# Patient Record
Sex: Male | Born: 1955 | Race: White | Hispanic: No | Marital: Single
Health system: Southern US, Community
[De-identification: ages and names within clinical notes are randomized; demographics above are authoritative.]

## PROBLEM LIST (undated history)

## (undated) DIAGNOSIS — I10 Essential (primary) hypertension: Secondary | ICD-10-CM

## (undated) DIAGNOSIS — E119 Type 2 diabetes mellitus without complications: Secondary | ICD-10-CM

---

## 2021-10-20 ENCOUNTER — Other Ambulatory Visit: Payer: Self-pay

## 2021-10-20 ENCOUNTER — Emergency Department (HOSPITAL_COMMUNITY)
Admission: EM | Admit: 2021-10-20 | Discharge: 2021-10-20 | Disposition: A | Discharge status: Home / Self Care | Payer: Medicare Other | Attending: Emergency Medicine | Admitting: Emergency Medicine

## 2021-10-20 ENCOUNTER — Encounter (HOSPITAL_COMMUNITY): Payer: Self-pay

## 2021-10-20 ENCOUNTER — Emergency Department (HOSPITAL_COMMUNITY): Payer: Medicare Other

## 2021-10-20 DIAGNOSIS — E162 Hypoglycemia, unspecified: Secondary | ICD-10-CM | POA: Diagnosis not present

## 2021-10-20 DIAGNOSIS — R062 Wheezing: Secondary | ICD-10-CM | POA: Insufficient documentation

## 2021-10-20 DIAGNOSIS — R404 Transient alteration of awareness: Secondary | ICD-10-CM

## 2021-10-20 DIAGNOSIS — R4182 Altered mental status, unspecified: Secondary | ICD-10-CM | POA: Diagnosis present

## 2021-10-20 DIAGNOSIS — Z8616 Personal history of COVID-19: Secondary | ICD-10-CM | POA: Insufficient documentation

## 2021-10-20 HISTORY — DX: Essential (primary) hypertension: I10

## 2021-10-20 HISTORY — DX: Type 2 diabetes mellitus without complications: E11.9

## 2021-10-20 LAB — CBC WITH DIFFERENTIAL/PLATELET
Abs Immature Granulocytes: 0.03 10*3/uL (ref 0.00–0.07)
Basophils Absolute: 0 10*3/uL (ref 0.0–0.1)
Basophils Relative: 1 %
Eosinophils Absolute: 0.1 10*3/uL (ref 0.0–0.5)
Eosinophils Relative: 3 %
HCT: 36.4 % — ABNORMAL LOW (ref 39.0–52.0)
Hemoglobin: 12 g/dL — ABNORMAL LOW (ref 13.0–17.0)
Immature Granulocytes: 1 %
Lymphocytes Relative: 8 %
Lymphs Abs: 0.2 10*3/uL — ABNORMAL LOW (ref 0.7–4.0)
MCH: 32.7 pg (ref 26.0–34.0)
MCHC: 33 g/dL (ref 30.0–36.0)
MCV: 99.2 fL (ref 80.0–100.0)
Monocytes Absolute: 0.4 10*3/uL (ref 0.1–1.0)
Monocytes Relative: 14 %
Neutro Abs: 2.2 10*3/uL (ref 1.7–7.7)
Neutrophils Relative %: 73 %
Platelets: 139 10*3/uL — ABNORMAL LOW (ref 150–400)
RBC: 3.67 MIL/uL — ABNORMAL LOW (ref 4.22–5.81)
RDW: 13.9 % (ref 11.5–15.5)
WBC: 3 10*3/uL — ABNORMAL LOW (ref 4.0–10.5)
nRBC: 0 % (ref 0.0–0.2)

## 2021-10-20 LAB — COMPREHENSIVE METABOLIC PANEL
ALT: 57 U/L — ABNORMAL HIGH (ref 0–44)
AST: 30 U/L (ref 15–41)
Albumin: 2.4 g/dL — ABNORMAL LOW (ref 3.5–5.0)
Alkaline Phosphatase: 60 U/L (ref 38–126)
Anion gap: 8 (ref 5–15)
BUN: 12 mg/dL (ref 8–23)
CO2: 27 mmol/L (ref 22–32)
Calcium: 8 mg/dL — ABNORMAL LOW (ref 8.9–10.3)
Chloride: 100 mmol/L (ref 98–111)
Creatinine, Ser: 0.72 mg/dL (ref 0.61–1.24)
GFR, Estimated: >60 mL/min (ref >60)
Glucose, Bld: 60 mg/dL — ABNORMAL LOW (ref 70–99)
Potassium: 3.5 mmol/L (ref 3.5–5.1)
Sodium: 135 mmol/L (ref 135–145)
Total Bilirubin: 0.9 mg/dL (ref 0.3–1.2)
Total Protein: 5.2 g/dL — ABNORMAL LOW (ref 6.5–8.1)

## 2021-10-20 LAB — CBG MONITORING, ED
Glucose-Capillary: 64 mg/dL — ABNORMAL LOW (ref 70–99)
Glucose-Capillary: 50 mg/dL — ABNORMAL LOW (ref 70–99)
Glucose-Capillary: 64 mg/dL — ABNORMAL LOW (ref 70–99)
Glucose-Capillary: 106 mg/dL — ABNORMAL HIGH (ref 70–99)
Glucose-Capillary: 125 mg/dL — ABNORMAL HIGH (ref 70–99)

## 2021-10-20 MED ORDER — IPRATROPIUM-ALBUTEROL 0.5-2.5 (3) MG/3ML IN SOLN
3.0000 mL | Freq: Once | RESPIRATORY_TRACT | Status: AC
  Administered 2021-10-20: 3 mL via RESPIRATORY_TRACT
  Filled 2021-10-20: qty 3

## 2021-10-20 NOTE — ED Provider Notes (Signed)
[facility] Provider Note   CSN: [id number] Arrival date & time: 10/20/21  [id number]     History  Chief Complaint  Patient presents with   Altered Mental Status    Edwin BickersMichael Andrews is a 66 y.o. male.  66 yo M with a chief complaint of confusion.  That was noted this morning.  Was found to be hypoglycemic with a blood sugar in the 30s per him.  He was given D50 with EMS with improvement of his mental status.  For some reason the EMS provider felt that he might be having a stroke and so bypassed multiple hospitals to bring him here.  They felt like even though his mental status improved completely with D50 there might be something else going on.  He does endorse being recently in the hospital for COVID and has been treated with antibiotics and steroids.  The history is provided by the patient.  Altered Mental Status     Home Medications Prior to Admission medications   Not on File      Allergies    Patient has no known allergies.    Review of Systems   Review of Systems  Physical Exam Updated Vital Signs BP 101/65    Pulse 76    Temp 97.6 F (36.4 C) (Oral)    Resp 16    Ht 6\' 3"  (1.905 m)    Wt 136.1 kg    SpO2 100%    BMI 37.50 kg/m  Physical Exam Vitals and nursing note reviewed.  Constitutional:      Appearance: He is well-developed.  HENT:     Head: Normocephalic and atraumatic.  Eyes:     Pupils: Pupils are equal, round, and reactive to light.  Neck:     Vascular: No JVD.  Cardiovascular:     Rate and Rhythm: Normal rate and regular rhythm.     Heart sounds: No murmur heard.   No friction rub. No gallop.  Pulmonary:     Effort: No respiratory distress.     Breath sounds: Wheezing present.     Comments: Diffuse wheezes.  Prolonged expiratory effort.  Abdominal:     General: There is no distension.     Tenderness: There is no abdominal tenderness. There is no guarding or rebound.  Musculoskeletal:        General: Normal  range of motion.     Cervical back: Normal range of motion and neck supple.  Skin:    Coloration: Skin is not pale.     Findings: No rash.  Neurological:     Mental Status: He is alert and oriented to person, place, and time.  Psychiatric:        Behavior: Behavior normal.    ED Results / Procedures / Treatments   Labs (all labs ordered are listed, but only abnormal results are displayed) Labs Reviewed  CBC WITH DIFFERENTIAL/PLATELET - Abnormal; Notable for the following components:      Result Value   WBC 3.0 (*)    RBC 3.67 (*)    Hemoglobin 12.0 (*)    HCT 36.4 (*)    Platelets 139 (*)    Lymphs Abs 0.2 (*)    All other components within normal limits  COMPREHENSIVE METABOLIC PANEL - Abnormal; Notable for the following components:   Glucose, Bld 60 (*)    Calcium 8.0 (*)    Total Protein 5.2 (*)    Albumin 2.4 (*)    ALT 57 (*)  All other components within normal limits  CBG MONITORING, ED - Abnormal; Notable for the following components:   Glucose-Capillary 64 (*)    All other components within normal limits  CBG MONITORING, ED - Abnormal; Notable for the following components:   Glucose-Capillary 64 (*)    All other components within normal limits  CBG MONITORING, ED - Abnormal; Notable for the following components:   Glucose-Capillary 50 (*)    All other components within normal limits  CBG MONITORING, ED - Abnormal; Notable for the following components:   Glucose-Capillary 106 (*)    All other components within normal limits  CBG MONITORING, ED - Abnormal; Notable for the following components:   Glucose-Capillary 125 (*)    All other components within normal limits    EKG None  Radiology DG Chest Port 1 View  Result Date: 10/20/2021 CLINICAL DATA:  Shortness of breath. EXAM: PORTABLE CHEST 1 VIEW COMPARISON:  10/11/2021.  10/05/2021. FINDINGS: Cardiac silhouette is normal in size. No mediastinal or hilar masses. Stable left anterior chest wall  Port-A-Cath. There is linear opacity extending laterally from the left hilum, new from the prior exam. There is persistent lung base opacities, greater on the left. Remainder of the lungs is clear. Suspected small left effusion. No pneumothorax. IMPRESSION: 1. Findings are similar to the most recent prior exam. 2. There is lung base opacities, greater on the left, which may reflect atelectasis and/or pneumonia. Small left effusion suspected. Mild linear opacity in the left mid lung is new, also consistent with atelectasis or infiltrate. No convincing pulmonary edema. Electronically Signed   By: Amie Portland M.D.   On: 10/20/2021 09:27    Procedures Procedures    Medications Ordered in ED Medications  ipratropium-albuterol (DUONEB) 0.5-2.5 (3) MG/3ML nebulizer solution 3 mL (3 mLs Nebulization Given 10/20/21 1016)    ED Course/ Medical Decision Making/ A&P                           Medical Decision Making Amount and/or Complexity of Data Reviewed Labs: ordered. Radiology: ordered.  Risk Prescription drug management.   Patient is a 66 y.o. male with a cc of altered mental status.  Was found to be hypoglycemic per EMS and was given D50 with improvement.  Patient tells me that he has been having some mild difficulty coughing but has been going on for a couple years.  He thinks is due to his COPD.  Tells me that he always seems to have pneumonia.  Is chronically on 3 L of oxygen at all times.  He denies any recent medication change other than antibiotics and steroids posthospitalization.  He denies any change to his insulin therapy.  Has been eating and drinking without issue.  Denies nausea vomiting or diarrhea.  I will obtain a laboratory evaluation to assess for renal dysfunction.  We will observe in the ER.  He is wheezing on my exam and will obtain a chest x-ray and give a DuoNeb.  Reassess. I reviewed the patients chart and he was recently in [facility] with COVID was treated with  both antiviral medicines and antibiotics and steroids..  I independently interpreted the patients labs and imaging chest x-ray viewed by me with possible left lower lobe infiltrate.  Seems consistent with his prior x-rays per radiology.  With him recently being treated with antibiotics for viral pneumonia do not feel strongly that I should reinitiate antibiotic therapy.  His blood sugar is  persistently low here though the patient has what seems like poor perfusion at baseline once the patient's hands was warmed his blood sugar had improved.  He was able to eat and drink here without issue and has had no recurrence of altered mental status.  We will have the patient follow-up with his family doctor in the office.  We will have him hold off on taking his insulin today.Marland Kitchen  CRITICAL CARE Performed by: Rae Roam   Total critical care time: 35 minutes  Critical care time was exclusive of separately billable procedures and treating other patients.  Critical care was necessary to treat or prevent imminent or life-threatening deterioration.  Critical care was time spent personally by me on the following activities: development of treatment plan with patient and/or surrogate as well as nursing, discussions with consultants, evaluation of patient's response to treatment, examination of patient, obtaining history from patient or surrogate, ordering and performing treatments and interventions, ordering and review of laboratory studies, ordering and review of radiographic studies, pulse oximetry and re-evaluation of patient's condition.   11:25 AM:  I have discussed the diagnosis/risks/treatment options with the patient and family.  Evaluation and diagnostic testing in the emergency department does not suggest an emergent condition requiring admission or immediate intervention beyond what has been performed at this time.  They will follow up with  PCP. We also discussed returning to the ED immediately if  new or worsening sx occur. We discussed the sx which are most concerning (e.g., sudden worsening pain, fever, inability to tolerate by mouth) that necessitate immediate return. Medications administered to the patient during their visit and any new prescriptions provided to the patient are listed below.  Medications given during this visit Medications  ipratropium-albuterol (DUONEB) 0.5-2.5 (3) MG/3ML nebulizer solution 3 mL (3 mLs Nebulization Given 10/20/21 1016)     The patient appears reasonably screen and/or stabilized for discharge and I doubt any other medical condition or other [facility] requiring further screening, evaluation, or treatment in the ED at this time prior to discharge.          Final Clinical Impression(s) / ED Diagnoses Final diagnoses:  Transient alteration of awareness  Hypoglycemia    Rx / DC Orders ED Discharge Orders     None         Melene Plan, DO 10/20/21 1125

## 2021-10-20 NOTE — Discharge Instructions (Signed)
I would hold off on taking your insulin today.  Please recheck your blood sugar tomorrow morning and if it is elevated you can consider retaking it.  Seeing in touch with your family doctor this weekend and see if they have any other recommendations.  Please return for recurrent or worsening symptoms or if you have worsening difficulty breathing or develop a fever.

## 2021-10-20 NOTE — ED Notes (Signed)
Pt ambulated back from RR pt was a little SOB once in bed put on monitor o2 was 93% then moved to 98 %. Hr between 70- 80

## 2021-10-20 NOTE — ED Notes (Signed)
Pt ambulated to RR. Pt did well

## 2021-10-20 NOTE — ED Triage Notes (Signed)
Pt BIB [facility] from home c/o AMS. Per family pt would not get up off the couch and was repeating things and that was unusual for him. Pt wears 3L [place] at baseline. Per EMS CBG was 67. Pt was given an amp of D50. Pt now alert and oriented x4. CBG now 124.

## 2022-10-26 ENCOUNTER — Encounter (HOSPITAL_COMMUNITY): Payer: Self-pay

## 2022-11-08 ENCOUNTER — Emergency Department (HOSPITAL_COMMUNITY)
Admission: EM | Admit: 2022-11-08 | Discharge: 2022-11-09 | Disposition: A | Discharge status: Skilled Nursing Facility | Payer: Medicare Other | Attending: Emergency Medicine | Admitting: Emergency Medicine

## 2022-11-08 ENCOUNTER — Encounter (HOSPITAL_COMMUNITY): Payer: Self-pay | Admitting: *Deleted

## 2022-11-08 ENCOUNTER — Other Ambulatory Visit: Payer: Self-pay

## 2022-11-08 DIAGNOSIS — Z96642 Presence of left artificial hip joint: Secondary | ICD-10-CM | POA: Insufficient documentation

## 2022-11-08 DIAGNOSIS — E119 Type 2 diabetes mellitus without complications: Secondary | ICD-10-CM | POA: Insufficient documentation

## 2022-11-08 DIAGNOSIS — M25552 Pain in left hip: Secondary | ICD-10-CM | POA: Diagnosis present

## 2022-11-08 DIAGNOSIS — I10 Essential (primary) hypertension: Secondary | ICD-10-CM | POA: Insufficient documentation

## 2022-11-08 DIAGNOSIS — W010XXA Fall on same level from slipping, tripping and stumbling without subsequent striking against object, initial encounter: Secondary | ICD-10-CM | POA: Insufficient documentation

## 2022-11-08 NOTE — ED Triage Notes (Signed)
Pt from [facility] for hip pain post fall. Pt reports trying to get to the bathroom slipped from the bed onto the floor. Recent surgery to L hip, c/o increased pain to same hip. Pt is on Eliquis, denies hitting his head. Pain 9/10, port noted to L chest. Chronically on 2L oxygen

## 2022-11-09 ENCOUNTER — Emergency Department (HOSPITAL_COMMUNITY): Payer: Medicare Other

## 2022-11-09 DIAGNOSIS — M25552 Pain in left hip: Secondary | ICD-10-CM | POA: Diagnosis not present

## 2022-11-09 LAB — CBC
HCT: 34.8 % — ABNORMAL LOW (ref 39.0–52.0)
Hemoglobin: 9.6 g/dL — ABNORMAL LOW (ref 13.0–17.0)
MCH: 27 pg (ref 26.0–34.0)
MCHC: 27.6 g/dL — ABNORMAL LOW (ref 30.0–36.0)
MCV: 97.8 fL (ref 80.0–100.0)
Platelets: 277 10*3/uL (ref 150–400)
RBC: 3.56 MIL/uL — ABNORMAL LOW (ref 4.22–5.81)
RDW: 17.1 % — ABNORMAL HIGH (ref 11.5–15.5)
WBC: 7.1 10*3/uL (ref 4.0–10.5)
nRBC: 0 % (ref 0.0–0.2)

## 2022-11-09 LAB — COMPREHENSIVE METABOLIC PANEL
ALT: 12 U/L (ref 0–44)
AST: 12 U/L — ABNORMAL LOW (ref 15–41)
Albumin: 2.8 g/dL — ABNORMAL LOW (ref 3.5–5.0)
Alkaline Phosphatase: 144 U/L — ABNORMAL HIGH (ref 38–126)
Anion gap: 8 (ref 5–15)
BUN: 16 mg/dL (ref 8–23)
CO2: 33 mmol/L — ABNORMAL HIGH (ref 22–32)
Calcium: 8.1 mg/dL — ABNORMAL LOW (ref 8.9–10.3)
Chloride: 98 mmol/L (ref 98–111)
Creatinine, Ser: 0.66 mg/dL (ref 0.61–1.24)
GFR, Estimated: >60 mL/min (ref >60)
Glucose, Bld: 138 mg/dL — ABNORMAL HIGH (ref 70–99)
Potassium: 3.1 mmol/L — ABNORMAL LOW (ref 3.5–5.1)
Sodium: 139 mmol/L (ref 135–145)
Total Bilirubin: 0.6 mg/dL (ref 0.3–1.2)
Total Protein: 6.1 g/dL — ABNORMAL LOW (ref 6.5–8.1)

## 2022-11-09 MED ORDER — HYDROMORPHONE HCL 1 MG/ML IJ SOLN
1.0000 mg | Freq: Once | INTRAMUSCULAR | Status: AC
  Administered 2022-11-09: 1 mg via INTRAVENOUS
  Filled 2022-11-09: qty 1

## 2022-11-09 NOTE — ED Provider Notes (Signed)
[facility] Emergency Department Provider Note MRN:  [id number]  Arrival date & time: 11/09/22     Chief Complaint   Hip Pain   History of Present Illness   Edwin Andrews is a 67 y.o. year-old male with a history of hypertension, diabetes presenting to the ED with chief complaint of hip pain.  Patient explains that he was walking to the bathroom this evening and tripped and fell.  Having pain to the left hip.  Has a history of hip replacement on this side.  Denies head trauma or loss of consciousness, no neck or back pain, no chest pain or shortness of breath, no abdominal pain.  Review of Systems  A thorough review of systems was obtained and all systems are negative except as noted in the HPI and PMH.   Patient's Health History    Past Medical History:  Diagnosis Date   Diabetes mellitus without complication ([place])    Hypertension     History reviewed. No pertinent surgical history.  History reviewed. No pertinent family history.  Social History   Socioeconomic History   Marital status: Single    Spouse name: Not on file   Number of children: Not on file   Years of education: Not on file   Highest education level: Not on file  Occupational History   Not on file  Tobacco Use   Smoking status: Not on file   Smokeless tobacco: Not on file  Substance and Sexual Activity   Alcohol use: Not on file   Drug use: Not on file   Sexual activity: Not on file  Other Topics Concern   Not on file  Social History Narrative   Not on file   Social Determinants of Health   Financial Resource Strain: Not on file  Food Insecurity: Not on file  Transportation Needs: Not on file  Physical Activity: Not on file  Stress: Not on file  Social Connections: Not on file  Intimate Partner Violence: Not on file     Physical Exam   Vitals:   11/09/22 0310 11/09/22 0315  BP:    Pulse:    Resp:  12  Temp: 98.2 F (36.8 C)   SpO2:      CONSTITUTIONAL:  Well-appearing, NAD NEURO/PSYCH:  Alert and oriented x 3, no focal deficits EYES:  eyes equal and reactive ENT/NECK:  no LAD, no JVD CARDIO: Regular rate, well-perfused, normal S1 and S2 PULM:  CTAB no wheezing or rhonchi GI/GU:  non-distended, non-tender MSK/SPINE:  No gross deformities, no edema SKIN:  no rash, atraumatic   *Additional and/or pertinent findings included in MDM below  Diagnostic and Interventional Summary    EKG Interpretation  Date/Time:    Ventricular Rate:    PR Interval:    QRS Duration:   QT Interval:    QTC Calculation:   R Axis:     Text Interpretation:         Labs Reviewed  CBC - Abnormal; Notable for the following components:      Result Value   RBC 3.56 (*)    Hemoglobin 9.6 (*)    HCT 34.8 (*)    MCHC 27.6 (*)    RDW 17.1 (*)    All other components within normal limits  COMPREHENSIVE METABOLIC PANEL - Abnormal; Notable for the following components:   Potassium 3.1 (*)    CO2 33 (*)    Glucose, Bld 138 (*)    Calcium 8.1 (*)  Total Protein 6.1 (*)    Albumin 2.8 (*)    AST 12 (*)    Alkaline Phosphatase 144 (*)    All other components within normal limits    DG Hip Unilat W or Wo Pelvis 2-3 Views Left  Final Result      Medications  HYDROmorphone (DILAUDID) injection 1 mg (1 mg Intravenous Given 11/09/22 0027)     Procedures  /  Critical Care Procedures  ED Course and Medical Decision Making  Initial Impression and Ddx Seems to be an isolated hip injury from fall.  Neurovascularly intact.  Anticoagulated but no signs of head trauma, denies head trauma, no loss of consciousness.  Seems to be in a lot of pain and so DDx includes fracture versus dislocation.  Providing pain control, x-ray pending.  Past medical/surgical history that increases complexity of ED encounter: History of hip replacement  Interpretation of Diagnostics I personally reviewed the hip x-ray and my interpretation is as follows: No fracture  Labs  reassuring with no significant blood count or electrolyte disturbance  Patient Reassessment and Ultimate Disposition/Management     Appropriate for discharge.  Patient management required discussion with the following services or consulting groups:  None  Complexity of Problems Addressed Acute illness or injury that poses threat of life of bodily function  Additional Data Reviewed and Analyzed Further history obtained from: None  Additional Factors Impacting ED Encounter Risk None  Edwin Andrews. Sedonia Small, MD [place] [email or web address]  Final Clinical Impressions(s) / ED Diagnoses     ICD-10-CM   1. Left hip pain  M25.552       ED Discharge Orders     None        Discharge Instructions Discussed with and Provided to Patient:     Discharge Instructions      You were evaluated in the Emergency Department and after careful evaluation, we did not find any emergent condition requiring admission or further testing in the hospital.  Your exam/testing today was overall reassuring.  X-ray without signs of any broken bone.  Recommend Tylenol at home for discomfort.  Please return to the Emergency Department if you experience any worsening of your condition.  Thank you for allowing [place] to be a part of your care.        Edwin Flakes, MD 11/09/22 [phone]

## 2022-11-09 NOTE — ED Notes (Signed)
Report called to [place] at [facility].  Advised that transport has been called but no ETA.

## 2022-11-09 NOTE — ED Notes (Signed)
Call to [place] at [facility] to advise that transport was here to take pt back to facility.

## 2022-11-09 NOTE — Discharge Instructions (Signed)
You were evaluated in the Emergency Department and after careful evaluation, we did not find any emergent condition requiring admission or further testing in the hospital.  Your exam/testing today was overall reassuring.  X-ray without signs of any broken bone.  Recommend Tylenol at home for discomfort.  Please return to the Emergency Department if you experience any worsening of your condition.  Thank you for allowing [place] to be a part of your care.

## 2022-12-25 NOTE — H&P (Signed)
Surgical History & Physical  Patient Name: Edwin Andrews  DOB: 02-Jan-1956  Surgery: Cataract extraction with intraocular lens implant phacoemulsification; Right Eye Surgeon: Ronn Melena MD Surgery Date: 01/03/2023 Pre-Op Date: 11/12/2022  HPI: A 17 Yr. old male patient present for cataract eval per Dr. Wynetta Emery. 1. The patient complains of difficulty when filling out forms, vision seems blurred, which began 6 months ago. Both eyes are affected OD>OS. The episode is constant. The condition's severity is worsening. This is negatively affecting the patient's quality of life and the patient is unable to function adequately in life with the current level of vision.  Medical History: Cancer Diabetes High Blood Pressure LDL Lung Problems depression  Social Never Smoked  Medication Ipratropium Bromide, Spiriva, Jardiance, Metformin, Montelukast, Omeprazole, Paxil, Thiamine, Humalog, Gabapentin, Lasix, Metoprolol, Amlodipine, Apixaban, Atorvastatin, Lenalidomide   Sx/Procedures None  Drug Allergies  NKDA  Review of Systems Negative Allergic/Immunologic Cardiovascular - HTN Negative Constitutional Negative Ear, Nose, Mouth & Throat Negative Endocrine Negative Eyes Negative Gastrointestinal Negative Genitourinary Negative Hematologic/Lymphatic Negative Integumentary Negative Musculoskeletal Negative Neurological Psychiatry - Depression Respiratory - Lung problems  ROS: History & Physical: Heent: cataracts  NECK: supple without bruits LUNGS: lungs clear to auscultation CV: regular rate and rhythm Abdomen: soft and non-tender  Impression & Plan: Assessment: 1.  CATARACT AGE-RELATED NUCLEAR; Both Eyes (H25.13) 2.  Dry Eye Syndrome; Both Eyes (H04.123) 3.  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  Myopia ; Both Eyes (H52.13)  Plan: 1.  Cataracts are visually significant and account for the patient's  complaints. Discussed all risks, benefits, procedures and recovery, including infection, loss of vision and eye, need for glasses after surgery or additional procedures. Patient understands changing glasses will not improve vision. Patient indicated understanding of procedure. All questions answered. Patient desires to have surgery, recommend phacoemulsification with intraocular lens. Patient to have preliminary testing necessary ([employer]/IOL Master, Mac OCT, TOPO) Educational materials provided.  Plan: - proceed with cataract surgery OD, hold OS - have RayOne lens and MA60AC calculated, target -0.25 - on chronic O2 for COPD, reports he is able to lie flat - limited mobility, broke his foot 3 weeks ago and in a cast - will need clearance from his PCP prior to surgery - aim for late April surgery  2.  Patient asymptomatic but with significant exam findings. Will start with preserved artificial tears QID OU  3.  Moderate NPDR OS and unable to view retina OD. Will need repeat dilated exam after cataract extraction  4.  Continue with current for now

## 2022-12-30 ENCOUNTER — Encounter (HOSPITAL_COMMUNITY)
Admission: RE | Admit: 2022-12-30 | Discharge: 2022-12-30 | Disposition: A | Discharge status: Home / Self Care | Payer: Medicare Other | Source: Ambulatory Visit | Attending: Optometry | Admitting: Optometry

## 2022-12-30 NOTE — Pre-Procedure Instructions (Signed)
Attempted pre-op phone call. Phone states that "call cannot be completed at this time, please call back later."

## 2022-12-31 ENCOUNTER — Encounter (HOSPITAL_COMMUNITY): Payer: Self-pay

## 2022-12-31 ENCOUNTER — Other Ambulatory Visit: Payer: Self-pay

## 2022-12-31 NOTE — Patient Instructions (Signed)
    Edwin Andrews  12/31/2022     @PREFPERIOPPHARMACY @   Your procedure is scheduled on  01/03/2023.   Report to [name] at  0730  A.M.           Please make a copy of patients med list to bring with you after he takes his morning meds so we will know what medications he has taken that morning.   Call this number if you have problems the morning of surgery:  [phone]  If you experience any cold or flu symptoms such as cough, fever, chills, shortness of breath, etc. between now and your scheduled surgery, please notify us at the above number.   Remember:           Your last dose of jardiance should be on 12/31/2022.    Do not eat or drink after midnight.   Take 1/2 of usual insulin dosage the night before your procedure.           DO NOT take any medications for diabetes the morning of your procedure.     Take these medicines the morning of surgery with A SIP OF WATER                            omeprazole, paxil, amlodipine, metoprolol.     Do not wear jewelry, make-up or nail polish.  Do not wear lotions, powders, or perfumes, or deodorant.  Do not shave 48 hours prior to surgery.  Men may shave face and neck.  Do not bring valuables to the hospital.  [facility] is not responsible for any belongings or valuables.  Contacts, dentures or bridgework may not be worn into surgery.  Leave your suitcase in the car.  After surgery it may be brought to your room.  For patients admitted to the hospital, discharge time will be determined by your treatment team.  Patients discharged the day of surgery will not be allowed to drive home and must have someone with them for 24 hours    Special instructions:   DO NOT smoke tobacco or vape for 24 hours before your procedure.  Please read over the following fact sheets that you were given. Anesthesia Post-op Instructions and Care and Recovery After Surgery

## 2023-01-03 ENCOUNTER — Ambulatory Visit (HOSPITAL_COMMUNITY): Payer: Medicare Other | Admitting: Certified Registered"

## 2023-01-03 ENCOUNTER — Ambulatory Visit (HOSPITAL_COMMUNITY)
Admission: RE | Admit: 2023-01-03 | Discharge: 2023-01-03 | Disposition: A | Discharge status: Skilled Nursing Facility | Payer: Medicare Other | Source: Ambulatory Visit | Attending: Optometry | Admitting: Optometry

## 2023-01-03 ENCOUNTER — Ambulatory Visit (HOSPITAL_BASED_OUTPATIENT_CLINIC_OR_DEPARTMENT_OTHER): Payer: Medicare Other | Admitting: Certified Registered"

## 2023-01-03 ENCOUNTER — Encounter (HOSPITAL_COMMUNITY)
Admission: RE | Disposition: A | Discharge status: Skilled Nursing Facility | Payer: Self-pay | Source: Ambulatory Visit | Attending: Optometry

## 2023-01-03 DIAGNOSIS — H5213 Myopia, bilateral: Secondary | ICD-10-CM | POA: Insufficient documentation

## 2023-01-03 DIAGNOSIS — H25091 Other age-related incipient cataract, right eye: Secondary | ICD-10-CM | POA: Diagnosis not present

## 2023-01-03 DIAGNOSIS — E119 Type 2 diabetes mellitus without complications: Secondary | ICD-10-CM

## 2023-01-03 DIAGNOSIS — E1136 Type 2 diabetes mellitus with diabetic cataract: Secondary | ICD-10-CM | POA: Insufficient documentation

## 2023-01-03 DIAGNOSIS — J449 Chronic obstructive pulmonary disease, unspecified: Secondary | ICD-10-CM | POA: Diagnosis not present

## 2023-01-03 DIAGNOSIS — E113393 Type 2 diabetes mellitus with moderate nonproliferative diabetic retinopathy without macular edema, bilateral: Secondary | ICD-10-CM | POA: Insufficient documentation

## 2023-01-03 DIAGNOSIS — Z794 Long term (current) use of insulin: Secondary | ICD-10-CM | POA: Insufficient documentation

## 2023-01-03 DIAGNOSIS — H2521 Age-related cataract, morgagnian type, right eye: Secondary | ICD-10-CM | POA: Insufficient documentation

## 2023-01-03 DIAGNOSIS — I10 Essential (primary) hypertension: Secondary | ICD-10-CM | POA: Insufficient documentation

## 2023-01-03 DIAGNOSIS — Z9981 Dependence on supplemental oxygen: Secondary | ICD-10-CM | POA: Diagnosis not present

## 2023-01-03 DIAGNOSIS — H04123 Dry eye syndrome of bilateral lacrimal glands: Secondary | ICD-10-CM | POA: Insufficient documentation

## 2023-01-03 DIAGNOSIS — Z7984 Long term (current) use of oral hypoglycemic drugs: Secondary | ICD-10-CM | POA: Insufficient documentation

## 2023-01-03 HISTORY — PX: CATARACT EXTRACTION W/PHACO: SHX586

## 2023-01-03 LAB — GLUCOSE, CAPILLARY: Glucose-Capillary: 139 mg/dL — ABNORMAL HIGH (ref 70–99)

## 2023-01-03 SURGERY — PHACOEMULSIFICATION, CATARACT, WITH IOL INSERTION
Anesthesia: Monitor Anesthesia Care | Site: Eye | Laterality: Right

## 2023-01-03 MED ORDER — PHENYLEPHRINE HCL 2.5 % OP SOLN
1.0000 [drp] | OPHTHALMIC | Status: AC
  Administered 2023-01-03 (×3): 1 [drp] via OPHTHALMIC

## 2023-01-03 MED ORDER — TRYPAN BLUE 0.06 % IO SOSY
PREFILLED_SYRINGE | INTRAOCULAR | Status: DC | PRN
  Administered 2023-01-03: .5 mL via INTRAOCULAR

## 2023-01-03 MED ORDER — POVIDONE-IODINE 5 % OP SOLN
OPHTHALMIC | Status: DC | PRN
  Administered 2023-01-03: 1 via OPHTHALMIC

## 2023-01-03 MED ORDER — MIDAZOLAM HCL 2 MG/2ML IJ SOLN
INTRAMUSCULAR | Status: AC
  Filled 2023-01-03: qty 2

## 2023-01-03 MED ORDER — TRYPAN BLUE 0.06 % IO SOSY
PREFILLED_SYRINGE | INTRAOCULAR | Status: AC
  Filled 2023-01-03: qty 0.5

## 2023-01-03 MED ORDER — PHENYLEPHRINE-KETOROLAC 1-0.3 % IO SOLN
INTRAOCULAR | Status: DC | PRN
  Administered 2023-01-03: 500 mL via OPHTHALMIC

## 2023-01-03 MED ORDER — TROPICAMIDE 1 % OP SOLN
1.0000 [drp] | OPHTHALMIC | Status: AC
  Administered 2023-01-03 (×3): 1 [drp] via OPHTHALMIC

## 2023-01-03 MED ORDER — TETRACAINE 0.5 % OP SOLN OPTIME - NO CHARGE
OPHTHALMIC | Status: DC | PRN
  Administered 2023-01-03: 2 [drp] via OPHTHALMIC

## 2023-01-03 MED ORDER — STERILE WATER FOR IRRIGATION IR SOLN
Status: DC | PRN
  Administered 2023-01-03: 250 mL

## 2023-01-03 MED ORDER — MIDAZOLAM HCL 2 MG/2ML IJ SOLN
INTRAMUSCULAR | Status: DC | PRN
  Administered 2023-01-03: 1 mg via INTRAVENOUS

## 2023-01-03 MED ORDER — LIDOCAINE HCL 3.5 % OP GEL
1.0000 | Freq: Once | OPHTHALMIC | Status: AC
  Administered 2023-01-03: 1 via OPHTHALMIC

## 2023-01-03 MED ORDER — TETRACAINE HCL 0.5 % OP SOLN
1.0000 [drp] | OPHTHALMIC | Status: AC
  Administered 2023-01-03 (×3): 1 [drp] via OPHTHALMIC

## 2023-01-03 MED ORDER — LIDOCAINE HCL (PF) 1 % IJ SOLN
INTRAMUSCULAR | Status: DC | PRN
  Administered 2023-01-03: 1 mL

## 2023-01-03 MED ORDER — FENTANYL CITRATE (PF) 100 MCG/2ML IJ SOLN
INTRAMUSCULAR | Status: AC
  Filled 2023-01-03: qty 2

## 2023-01-03 MED ORDER — FENTANYL CITRATE (PF) 100 MCG/2ML IJ SOLN
INTRAMUSCULAR | Status: DC | PRN
  Administered 2023-01-03: 50 ug via INTRAVENOUS

## 2023-01-03 MED ORDER — SODIUM CHLORIDE 0.9% FLUSH
INTRAVENOUS | Status: DC | PRN
  Administered 2023-01-03: 5 mL via INTRAVENOUS

## 2023-01-03 MED ORDER — BSS IO SOLN
INTRAOCULAR | Status: DC | PRN
  Administered 2023-01-03: 15 mL via INTRAOCULAR

## 2023-01-03 MED ORDER — NEOMYCIN-POLYMYXIN-DEXAMETH 3.5-10000-0.1 OP SUSP
OPHTHALMIC | Status: DC | PRN
  Administered 2023-01-03: 2 [drp] via OPHTHALMIC

## 2023-01-03 SURGICAL SUPPLY — 16 items
CATARACT SUITE SIGHTPATH (MISCELLANEOUS) ×1 IMPLANT
CLOTH BEACON ORANGE TIMEOUT ST (SAFETY) ×1 IMPLANT
DRSG TEGADERM 4X4.75 (GAUZE/BANDAGES/DRESSINGS) ×1 IMPLANT
EYE SHIELD UNIVERSAL CLEAR (GAUZE/BANDAGES/DRESSINGS) IMPLANT
FEE CATARACT SUITE SIGHTPATH (MISCELLANEOUS) ×1 IMPLANT
GLOVE BIOGEL PI IND STRL 7.0 (GLOVE) ×2 IMPLANT
LENS IOL RAYNER 18.5 (Intraocular Lens) ×1 IMPLANT
LENS IOL RAYONE EMV 18.5 (Intraocular Lens) IMPLANT
NDL HYPO 18GX1.5 BLUNT FILL (NEEDLE) ×1 IMPLANT
NEEDLE HYPO 18GX1.5 BLUNT FILL (NEEDLE) ×1 IMPLANT
PAD ARMBOARD 7.5X6 YLW CONV (MISCELLANEOUS) ×1 IMPLANT
RING MALYGIN 7.0 (MISCELLANEOUS) IMPLANT
SUT NYLON 10-0 (SUTURE) IMPLANT
SYR TB 1ML LL NO SAFETY (SYRINGE) ×1 IMPLANT
TAPE SURG TRANSPORE 1 IN (GAUZE/BANDAGES/DRESSINGS) IMPLANT
WATER STERILE IRR 250ML POUR (IV SOLUTION) ×1 IMPLANT

## 2023-01-03 NOTE — Interval H&P Note (Signed)
History and Physical Interval Note:  01/03/2023 7:52 AM  The H and P was reviewed and updated. The patient was examined.  No changes were found after exam.  The surgical eye was marked.  Edwin Andrews

## 2023-01-03 NOTE — Op Note (Signed)
Date of procedure: 01/03/23  Pre-operative diagnosis: Mature Visually significant age-related cataract, Right Eye (H25.21)  Post-operative diagnosis: Mature Visually significant age-related cataract, Right Eye  Procedure: Complex Removal of cataract via phacoemulsification and insertion of intra-ocular lens Rayner RAO200E +18.5D into the capsular bag of the Right Eye  Attending surgeon: Pecolia Ades, MD  Anesthesia: MAC, Topical Akten  Complications: None  Estimated Blood Loss: <21mL (minimal)  Specimens: None  Implants:  Implant Name Type Inv. Item Serial No. Manufacturer Lot No. LRB No. Used Action  LENS IOL RAYNER 18.5 - S10 Intraocular Lens LENS IOL RAYNER 18.5 10 SIGHT[employer] [id number] Right 1 Implanted    Indications:  Mature Visually significant age-related cataract, Right Eye  Procedure:  The patient was seen and identified in the pre-operative area. The operative eye was identified and dilated.  The operative eye was marked.  Topical anesthesia was administered to the operative eye.     The patient was then to the operative suite and placed in the supine position.  A timeout was performed confirming the patient, procedure to be performed, and all other relevant information.   The patient's face was prepped and draped in the usual fashion for intra-ocular surgery.  A lid speculum was placed into the operative eye and the surgical microscope moved into place and focused.  A lack of red reflex due to a mature cataract was confirmed.  A superotemporal paracentesis was created using a 20 gauge paracentesis blade.  Vision blue was injected into the anterior chamber.  BSS mixed with Omidria, followed by 1% lidocaine was injected into the anterior chamber.  Viscoelastic was injected into the anterior chamber.  A temporal clear-corneal main wound incision was created using a 2.18mm microkeratome.  A 7.4mm Malyugin ring was placed. A continuous curvilinear capsulorrhexis was initiated using  an irrigating cystitome and completed using capsulorrhexis forceps.  Hydrodissection and hydrodeliniation were performed.  Viscoelastic was injected into the anterior chamber.  A phacoemulsification handpiece and a chopper as a second instrument were used to remove the nucleus and epinucleus. The irrigation/aspiration handpiece was used to remove any remaining cortical material.   The capsular bag was reinflated with viscoelastic, checked, and found to be intact. The intraocular lens was inserted into the capsular bag and dialed into place using a chopper.  The Malyugin ring was removed. The corneal main incision had anterior haze with some concern for phaco wound burn, therefore one 10-0 nylon suture was placed and rotated into place using a needle driver and calibri. Next a second main incision was made inferiorly to complete viscoelastic removal. The irrigation/aspiration handpiece was used to remove any remaining viscoelastic.  The clear corneal wound and paracentesis wounds were then hydrated and checked with Weck-Cels to be watertight. Maxitrol drops were placed on the ocular surface. The lid-speculum and drape was removed, and the patient's face was cleaned with a wet and dry 4x4. A clear shield was taped over the eye. The patient was taken to the post-operative care unit in good condition, having tolerated the procedure well.  Post-Op Instructions: The patient will follow up at [facility] for a same day post-operative evaluation and will receive all other orders and instructions.

## 2023-01-03 NOTE — Discharge Instructions (Signed)
Please discharge patient when stable, will follow up today with Dr. Dasie Chancellor at the [facility] office immediately following discharge.  Leave shield in place until visit.  All paperwork with discharge instructions will be given at the office.   [facility] Address:  [address]  [place], [place] [address]  Dr. Shanita Kanan's Phone: [phone]  

## 2023-01-03 NOTE — Anesthesia Preprocedure Evaluation (Signed)
Anesthesia Evaluation  Patient identified by MRN, date of birth, ID band Patient awake    Reviewed: Allergy & Precautions, H&P , NPO status , Patient's Chart, lab work & pertinent test results, reviewed documented beta blocker date and time   Airway Mallampati: II  TM Distance: >3 FB Neck ROM: full    Dental no notable dental hx.    Pulmonary neg pulmonary ROS   Pulmonary exam normal breath sounds clear to auscultation       Cardiovascular Exercise Tolerance: Good hypertension, negative cardio ROS  Rhythm:regular Rate:Normal     Neuro/Psych negative neurological ROS  negative psych ROS   GI/Hepatic negative GI ROS, Neg liver ROS,,,  Endo/Other  negative endocrine ROSdiabetes    Renal/GU negative Renal ROS  negative genitourinary   Musculoskeletal   Abdominal   Peds  Hematology negative hematology ROS (+)   Anesthesia Other Findings   Reproductive/Obstetrics negative OB ROS                             Anesthesia Physical Anesthesia Plan  ASA: 2  Anesthesia Plan: MAC   Post-op Pain Management:    Induction:   PONV Risk Score and Plan:   Airway Management Planned:   Additional Equipment:   Intra-op Plan:   Post-operative Plan:   Informed Consent: I have reviewed the patients History and Physical, chart, labs and discussed the procedure including the risks, benefits and alternatives for the proposed anesthesia with the patient or authorized representative who has indicated his/her understanding and acceptance.     Dental Advisory Given  Plan Discussed with: CRNA  Anesthesia Plan Comments:        Anesthesia Quick Evaluation

## 2023-01-03 NOTE — Anesthesia Procedure Notes (Signed)
Procedure Name: MAC Date/Time: 01/03/2023 8:34 AM  Performed by: Julian Reil, CRNAPre-anesthesia Checklist: Patient identified, Emergency Drugs available, Suction available and Patient being monitored Patient Re-evaluated:Patient Re-evaluated prior to induction Oxygen Delivery Method: Nasal cannula Induction Type: IV induction Placement Confirmation: positive ETCO2

## 2023-01-03 NOTE — Transfer of Care (Signed)
Immediate Anesthesia Transfer of Care Note  Patient: Edwin Andrews  Procedure(s) Performed: CATARACT EXTRACTION PHACO AND INTRAOCULAR LENS PLACEMENT (IOC) (Right: Eye)  Patient Location: Short Stay  Anesthesia Type:MAC  Level of Consciousness: awake, alert , and oriented  Airway & Oxygen Therapy: Patient Spontanous Breathing  Post-op Assessment: Report given to RN and Post -op Vital signs reviewed and stable  Post vital signs: Reviewed and stable  Last Vitals:  Vitals Value Taken Time  BP    Temp    Pulse    Resp    SpO2      Last Pain:  Vitals:   01/03/23 0754  PainSc: 0-No pain         Complications: No notable events documented.

## 2023-01-03 NOTE — Anesthesia Postprocedure Evaluation (Signed)
Anesthesia Post Note  Patient: Edwin Andrews  Procedure(s) Performed: CATARACT EXTRACTION PHACO AND INTRAOCULAR LENS PLACEMENT (IOC) (Right: Eye)  Patient location during evaluation: Phase II Anesthesia Type: MAC Level of consciousness: awake Pain management: pain level controlled Vital Signs Assessment: post-procedure vital signs reviewed and stable Respiratory status: spontaneous breathing and respiratory function stable Cardiovascular status: blood pressure returned to baseline and stable Postop Assessment: no headache and no apparent nausea or vomiting Anesthetic complications: no Comments: Late entry   No notable events documented.   Last Vitals:  Vitals:   01/03/23 0800 01/03/23 0859  BP:  (!) 148/78  Pulse: 70 75  Resp: 19 20  Temp:  36.6 C  SpO2: 93% 95%    Last Pain:  Vitals:   01/03/23 0859  TempSrc: Oral  PainSc: 0-No pain                 Windell Norfolk

## 2023-01-07 MED ORDER — SIGHTPATH DOSE#1 NA HYALUR & NA CHOND-NA HYALUR IO KIT
PACK | INTRAOCULAR | Status: AC | PRN
  Administered 2023-01-03: 1 via OPHTHALMIC

## 2023-01-08 IMAGING — DX DG CHEST 1V PORT
1 series · 1 of 1 positions shown · non-contrast
Comparison: 10/11/2021.  10/05/2021.

CLINICAL DATA: Shortness of breath.

EXAM:
PORTABLE CHEST 1 VIEW

[chest]
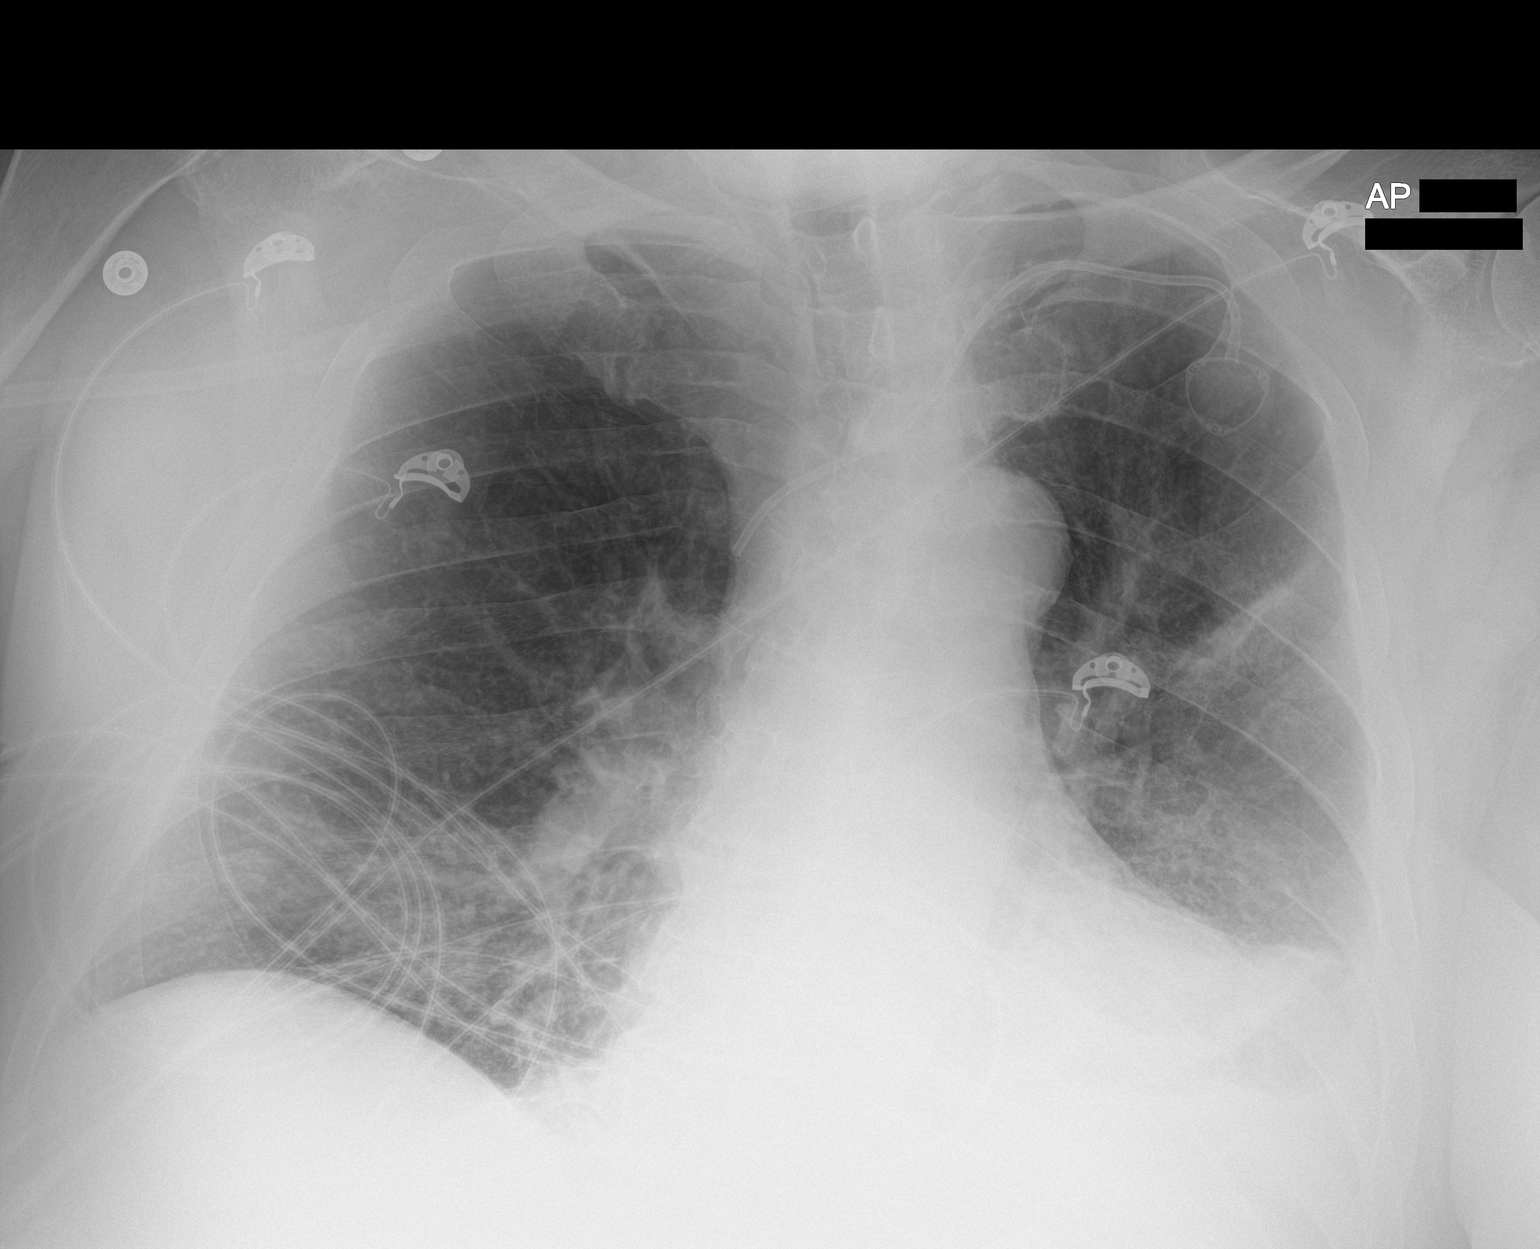

[1 of 1 positions shown; findings below may reference images not displayed]

FINDINGS: Cardiac silhouette is normal in size. No mediastinal or hilar
masses. Stable left anterior chest wall Port-A-Cath.

There is linear opacity extending laterally from the left hilum, new
from the prior exam. There is persistent lung base opacities,
greater on the left. Remainder of the lungs is clear.

Suspected small left effusion.

No pneumothorax.
IMPRESSION: 1. Findings are similar to the most recent prior exam.
2. There is lung base opacities, greater on the left, which may
reflect atelectasis and/or pneumonia. Small left effusion suspected.
Mild linear opacity in the left mid lung is new, also consistent
with atelectasis or infiltrate. No convincing pulmonary edema.

## 2023-01-09 ENCOUNTER — Encounter (HOSPITAL_COMMUNITY): Payer: Self-pay | Admitting: Optometry

## 2023-03-31 NOTE — H&P (Signed)
Surgical History & Physical  Patient Name: Edwin Andrews  DOB: November 16, 1955  Surgery: Cataract extraction with intraocular lens implant phacoemulsification; Left Eye Surgeon: Pecolia Ades MD Surgery Date: 04/04/2023 Pre-Op Date: 03/31/2023  HPI: A 4 Yr. old male patient present for cataract eval per Dr. Laural Benes. 1. The patient complains of difficulty when filling out forms, vision seems blurred, which began 6 months ago. Left eye is affected.  The episode is constant. The condition's severity is worsening. This is negatively affecting the patient's quality of life and the patient is unable to function adequately in life with the current level of vision.  Medical History:  Cancer Diabetes High Blood Pressure LDL Lung Problems Depression  Review of Systems Negative Allergic/Immunologic Cardiovascular: Hypertensive Negative Constitutional Negative Ear, Nose, Mouth & Throat Endocrine: Diabetic Negative Eyes Negative Gastrointestinal Negative Genitourinary Negative Hemotologic/Lymphatic Negative Integumentary Negative Musculoskeletal Negative Neurological Psychiatry: Depression Respiratory: Lung problems  Social Never Smoked  Medication Ciprofloxacin, Ketorolac, Prednisolone acetate 1%,  Ipratropium Bromide, Spiriva, Jardiance, Metformin, Montelukast, Omeprazole, Paxil, Thiamine, Humalog, Gabapentin, Lasix, Metoprolol, Amlodipine, Apixaban, Atorvastatin, Lenalidomide  Sx/Procedures Phaco c IOL OD,    Drug Allergies  NKDA  History & Physical: Heent: cataract OS, PCL OD NECK: supple without bruits LUNGS: lungs clear to auscultation CV: regular rate and rhythm Abdomen: soft and non-tender  Impression & Plan: Assessment: 1.  CATARACT AGE-RELATED NUCLEAR; Both Eyes (H25.13) 2.  Dry Eye Syndrome; Both Eyes (H04.123) 3.  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  Myopia ; Both Eyes (H52.13)  Plan: 1.   Cataracts are visually significant and account for the patient's complaints. Discussed all risks, benefits, procedures and recovery, including infection, loss of vision and eye, need for glasses after surgery or additional procedures. Patient understands changing glasses will not improve vision. Patient indicated understanding of procedure. All questions answered. Patient desires to have surgery, recommend phacoemulsification with intraocular lens. Patient to have preliminary testing necessary ([employer]/IOL Master, Mac OCT, TOPO) Educational materials provided.  Plan: - proceed with cataract surgery OD, hold OS - have RayOne lens and MA60AC calculated, target -0.25 - on chronic O2 for COPD, reports he is able to lie flat - limited mobility, broke his foot 3 weeks ago and in a cast - will need clearance from his PCP prior to surgery - aim for late April surgery  2.  Patient asymptomatic but with significant exam findings. Will start with preserved artificial tears QID OU  3.  Moderate NPDR OS and unable to view retina OD. Will need repeat dilated exam after cataract extraction  4.  Continue with current for now

## 2023-04-01 ENCOUNTER — Encounter (HOSPITAL_COMMUNITY)
Admission: RE | Admit: 2023-04-01 | Discharge: 2023-04-01 | Disposition: A | Discharge status: Home / Self Care | Payer: Medicare Other | Source: Ambulatory Visit | Attending: Optometry | Admitting: Optometry

## 2023-04-01 NOTE — Pre-Procedure Instructions (Signed)
Left VM on Edwin Andrews's machine @ [facility] concerning patients surgery. Left VM for her to call us back.

## 2023-04-04 ENCOUNTER — Ambulatory Visit (HOSPITAL_COMMUNITY): Payer: Medicare Other | Admitting: Anesthesiology

## 2023-04-04 ENCOUNTER — Ambulatory Visit (HOSPITAL_COMMUNITY)
Admission: RE | Admit: 2023-04-04 | Discharge: 2023-04-04 | Disposition: A | Discharge status: Nursing Home (Includes ICF) | Payer: Medicare Other | Attending: Optometry | Admitting: Optometry

## 2023-04-04 ENCOUNTER — Encounter (HOSPITAL_COMMUNITY): Payer: Self-pay | Admitting: Optometry

## 2023-04-04 ENCOUNTER — Encounter (HOSPITAL_COMMUNITY)
Admission: RE | Disposition: A | Discharge status: Nursing Home (Includes ICF) | Payer: Self-pay | Source: Home / Self Care | Attending: Optometry

## 2023-04-04 DIAGNOSIS — Z794 Long term (current) use of insulin: Secondary | ICD-10-CM | POA: Diagnosis not present

## 2023-04-04 DIAGNOSIS — J449 Chronic obstructive pulmonary disease, unspecified: Secondary | ICD-10-CM

## 2023-04-04 DIAGNOSIS — K219 Gastro-esophageal reflux disease without esophagitis: Secondary | ICD-10-CM | POA: Insufficient documentation

## 2023-04-04 DIAGNOSIS — Z7984 Long term (current) use of oral hypoglycemic drugs: Secondary | ICD-10-CM | POA: Diagnosis not present

## 2023-04-04 DIAGNOSIS — I10 Essential (primary) hypertension: Secondary | ICD-10-CM | POA: Insufficient documentation

## 2023-04-04 DIAGNOSIS — Z79899 Other long term (current) drug therapy: Secondary | ICD-10-CM | POA: Insufficient documentation

## 2023-04-04 DIAGNOSIS — Z9981 Dependence on supplemental oxygen: Secondary | ICD-10-CM | POA: Insufficient documentation

## 2023-04-04 DIAGNOSIS — F32A Depression, unspecified: Secondary | ICD-10-CM | POA: Insufficient documentation

## 2023-04-04 DIAGNOSIS — E1136 Type 2 diabetes mellitus with diabetic cataract: Secondary | ICD-10-CM

## 2023-04-04 DIAGNOSIS — H2512 Age-related nuclear cataract, left eye: Secondary | ICD-10-CM | POA: Insufficient documentation

## 2023-04-04 HISTORY — PX: CATARACT EXTRACTION W/PHACO: SHX586

## 2023-04-04 LAB — GLUCOSE, CAPILLARY: Glucose-Capillary: 117 mg/dL — ABNORMAL HIGH (ref 70–99)

## 2023-04-04 SURGERY — PHACOEMULSIFICATION, CATARACT, WITH IOL INSERTION
Anesthesia: Monitor Anesthesia Care | Site: Eye | Laterality: Left

## 2023-04-04 MED ORDER — SODIUM CHLORIDE 0.9% FLUSH
INTRAVENOUS | Status: DC | PRN
  Administered 2023-04-04: 5 mL via INTRAVENOUS

## 2023-04-04 MED ORDER — TROPICAMIDE 1 % OP SOLN
1.0000 [drp] | OPHTHALMIC | Status: AC | PRN
  Administered 2023-04-04 (×3): 1 [drp] via OPHTHALMIC

## 2023-04-04 MED ORDER — LIDOCAINE HCL 3.5 % OP GEL
1.0000 | Freq: Once | OPHTHALMIC | Status: AC
  Administered 2023-04-04: 1 via OPHTHALMIC

## 2023-04-04 MED ORDER — SIGHTPATH DOSE#1 NA HYALUR & NA CHOND-NA HYALUR IO KIT
PACK | INTRAOCULAR | Status: DC | PRN
  Administered 2023-04-04: 1 via OPHTHALMIC

## 2023-04-04 MED ORDER — BSS IO SOLN
INTRAOCULAR | Status: DC | PRN
  Administered 2023-04-04: 15 mL via INTRAOCULAR

## 2023-04-04 MED ORDER — MIDAZOLAM HCL 5 MG/5ML IJ SOLN
INTRAMUSCULAR | Status: DC | PRN
  Administered 2023-04-04: 1 mg via INTRAVENOUS

## 2023-04-04 MED ORDER — STERILE WATER FOR IRRIGATION IR SOLN
Status: DC | PRN
  Administered 2023-04-04: 250 mL

## 2023-04-04 MED ORDER — NEOMYCIN-POLYMYXIN-DEXAMETH 3.5-10000-0.1 OP SUSP
OPHTHALMIC | Status: DC | PRN
  Administered 2023-04-04: 2 [drp] via OPHTHALMIC

## 2023-04-04 MED ORDER — PHENYLEPHRINE-KETOROLAC 1-0.3 % IO SOLN
INTRAOCULAR | Status: DC | PRN
  Administered 2023-04-04: 500 mL via OPHTHALMIC

## 2023-04-04 MED ORDER — POVIDONE-IODINE 5 % OP SOLN
OPHTHALMIC | Status: DC | PRN
  Administered 2023-04-04: 1 via OPHTHALMIC

## 2023-04-04 MED ORDER — MIDAZOLAM HCL 2 MG/2ML IJ SOLN
INTRAMUSCULAR | Status: AC
  Filled 2023-04-04: qty 2

## 2023-04-04 MED ORDER — PHENYLEPHRINE HCL 2.5 % OP SOLN
1.0000 [drp] | OPHTHALMIC | Status: AC | PRN
  Administered 2023-04-04 (×3): 1 [drp] via OPHTHALMIC

## 2023-04-04 MED ORDER — LIDOCAINE HCL (PF) 1 % IJ SOLN
INTRAMUSCULAR | Status: DC | PRN
  Administered 2023-04-04: 2 mL

## 2023-04-04 MED ORDER — TETRACAINE HCL 0.5 % OP SOLN
1.0000 [drp] | OPHTHALMIC | Status: AC | PRN
  Administered 2023-04-04 (×3): 1 [drp] via OPHTHALMIC

## 2023-04-04 SURGICAL SUPPLY — 15 items
CATARACT SUITE SIGHTPATH (MISCELLANEOUS) ×1 IMPLANT
CLOTH BEACON ORANGE TIMEOUT ST (SAFETY) ×1 IMPLANT
DRSG TEGADERM 4X4.75 (GAUZE/BANDAGES/DRESSINGS) ×1 IMPLANT
EYE SHIELD UNIVERSAL CLEAR (GAUZE/BANDAGES/DRESSINGS) IMPLANT
FEE CATARACT SUITE SIGHTPATH (MISCELLANEOUS) ×1 IMPLANT
GLOVE BIOGEL PI IND STRL 7.0 (GLOVE) ×2 IMPLANT
LENS IOL RAYNER 17.5 (Intraocular Lens) ×1 IMPLANT
LENS IOL RAYONE EMV 17.5 (Intraocular Lens) IMPLANT
NDL HYPO 18GX1.5 BLUNT FILL (NEEDLE) ×1 IMPLANT
NEEDLE HYPO 18GX1.5 BLUNT FILL (NEEDLE) ×1 IMPLANT
PAD ARMBOARD 7.5X6 YLW CONV (MISCELLANEOUS) ×1 IMPLANT
POSITIONER HEAD 8X9X4 ADT (SOFTGOODS) ×1 IMPLANT
SYR TB 1ML LL NO SAFETY (SYRINGE) ×1 IMPLANT
TAPE SURG TRANSPORE 1 IN (GAUZE/BANDAGES/DRESSINGS) IMPLANT
WATER STERILE IRR 250ML POUR (IV SOLUTION) ×1 IMPLANT

## 2023-04-04 NOTE — Anesthesia Preprocedure Evaluation (Addendum)
Anesthesia Evaluation  Patient identified by MRN, date of birth, ID band Patient awake    Reviewed: Allergy & Precautions, H&P , NPO status , Patient's Chart, lab work & pertinent test results, reviewed documented beta blocker date and time   Airway Mallampati: II  TM Distance: >3 FB Neck ROM: Full    Dental  (+) Edentulous Upper, Poor Dentition, Dental Advisory Given   Pulmonary COPD,  COPD inhaler and oxygen dependent   Pulmonary exam normal breath sounds clear to auscultation       Cardiovascular Exercise Tolerance: Poor hypertension, Pt. on medications and Pt. on home beta blockers + dysrhythmias Atrial Fibrillation  Rhythm:Regular Rate:Normal  Left Ventricle  Left ventricle size is normal. Wall thickness is normal. Systolic function is moderately abnormal. EF: 40-45%. There is moderate hypokinesis of the left ventricle. Doppler parameters consistent with mild diastolic dysfunction and low to normal LA pressure.   Right Ventricle  Right ventricle size is normal. Systolic function is normal.   Left Atrium  Left atrium size is normal.   Right Atrium  Right atrium size is normal.   IVC/SVC  The inferior vena cava demonstrates a diameter of <=2.1 cm and collapses >50%; therefore, the right atrial pressure is estimated at 3 mmHg.   Mitral Valve  Mitral valve structure is normal. There is trace regurgitation.   Tricuspid Valve  Tricuspid valve structure is normal. There is mild regurgitation. The right ventricular systolic pressure is normal (<36 mmHg).   Aortic Valve  The aortic valve is tricuspid. The leaflets are not thickened and exhibit normal excursion. Trace aortic valve regurgitation. There is no evidence of aortic valve stenosis.   Pulmonic Valve  Pulmonic valve with normal excursion. Trace regurgitation.   Ascending Aorta  The aortic root is normal in size. The ascending aorta is normal in size.   Pericardium   There is no pericardial effusion.     Neuro/Psych negative neurological ROS  negative psych ROS   GI/Hepatic Neg liver ROS,GERD  Medicated and Controlled,,  Endo/Other  diabetes, Well Controlled, Type 2, Oral Hypoglycemic Agents, Insulin Dependent    Renal/GU negative Renal ROS  negative genitourinary   Musculoskeletal negative musculoskeletal ROS (+)    Abdominal   Peds negative pediatric ROS (+)  Hematology  (+) Blood dyscrasia (diffuse B Cell lymphoma)   Anesthesia Other Findings   Reproductive/Obstetrics negative OB ROS                              Anesthesia Physical Anesthesia Plan  ASA: 4  Anesthesia Plan: MAC   Post-op Pain Management: Minimal or no pain anticipated   Induction: Intravenous  PONV Risk Score and Plan: 0 and Treatment may vary due to age or medical condition  Airway Management Planned: Nasal Cannula and Natural Airway  Additional Equipment:   Intra-op Plan:   Post-operative Plan:   Informed Consent: I have reviewed the patients History and Physical, chart, labs and discussed the procedure including the risks, benefits and alternatives for the proposed anesthesia with the patient or authorized representative who has indicated his/her understanding and acceptance.     Dental advisory given  Plan Discussed with: CRNA and Surgeon  Anesthesia Plan Comments:        Anesthesia Quick Evaluation

## 2023-04-04 NOTE — Discharge Instructions (Signed)
Please discharge patient when stable, will follow up today with Dr. Lashauna Arpin at the [facility] office immediately following discharge.  Leave shield in place until visit.  All paperwork with discharge instructions will be given at the office.  [facility] Address:  [address]  [place], [place] [address]  Dr. Irys Nigh's Phone: [phone]  

## 2023-04-04 NOTE — Op Note (Signed)
Date of procedure: 04/04/23  Pre-operative diagnosis: Visually significant age-related nuclear cataract, Left Eye (H25.12)  Post-operative diagnosis: Visually significant age-related nuclear cataract, Left Eye  Procedure: Removal of cataract via phacoemulsification and insertion of intra-ocular lens Rayner RAO200E+17.5D into the capsular bag of the Left Eye  Attending surgeon: Ronal Fear, MD  Anesthesia: MAC, Topical Akten  Complications: None  Estimated Blood Loss: <86mL (minimal)  Specimens: None  Implants:  Implant Name Type Inv. Item Serial No. Manufacturer Lot No. LRB No. Used Action  LENS IOL RAYNER 17.5 - S31 Intraocular Lens LENS IOL RAYNER 17.5 31 [employer] [id number] Left 1 Implanted    Indications:  Visually significant age-related cataract, Left Eye  Procedure:  The patient was seen and identified in the pre-operative area. The operative eye was identified and dilated.  The operative eye was marked.  Topical anesthesia was administered to the operative eye.     The patient was then to the operative suite and placed in the supine position.  A timeout was performed confirming the patient, procedure to be performed, and all other relevant information.   The patient's face was prepped and draped in the usual fashion for intra-ocular surgery.  A lid speculum was placed into the operative eye and the surgical microscope moved into place and focused.  An inferotemporal paracentesis was created using a 20 gauge paracentesis blade.  BSS mixed with Omidria, followed by 1% lidocaine was injected into the anterior chamber.  Viscoelastic was injected into the anterior chamber.  A temporal clear-corneal main wound incision was created using a 2.34mm microkeratome.  A continuous curvilinear capsulorrhexis was initiated using an irrigating cystitome and completed using capsulorrhexis forceps.  Hydrodissection and hydrodeliniation were performed.  Viscoelastic was injected into the  anterior chamber.  A phacoemulsification handpiece and a chopper as a second instrument were used to remove the nucleus and epinucleus. The irrigation/aspiration handpiece was used to remove any remaining cortical material.   The capsular bag was reinflated with viscoelastic, checked, and found to be intact.  The intraocular lens was inserted into the capsular bag.  The irrigation/aspiration handpiece was used to remove any remaining viscoelastic.  The clear corneal wound and paracentesis wounds were then hydrated and checked with Weck-Cels to be watertight.  The lid-speculum and drape was removed, and the patient's face was cleaned with a wet and dry 4x4.  Maxitrol drops were instilled onto the eye. A clear shield was taped over the eye. The patient was taken to the post-operative care unit in good condition, having tolerated the procedure well.  Post-Op Instructions: The patient will follow up at [facility] for a same day post-operative evaluation and will receive all other orders and instructions.

## 2023-04-04 NOTE — Anesthesia Postprocedure Evaluation (Signed)
Anesthesia Post Note  Patient: Edwin Andrews  Procedure(s) Performed: CATARACT EXTRACTION PHACO AND INTRAOCULAR LENS PLACEMENT (IOC) (Left: Eye)  Patient location during evaluation: Short Stay Anesthesia Type: MAC Level of consciousness: awake and alert Pain management: pain level controlled Vital Signs Assessment: post-procedure vital signs reviewed and stable Respiratory status: spontaneous breathing Cardiovascular status: stable Postop Assessment: no apparent nausea or vomiting Anesthetic complications: no   No notable events documented.   Last Vitals:  Vitals:   04/04/23 1007 04/04/23 1015  BP:  132/77  Pulse: 72 73  Resp: 15 (!) 22  Temp:    SpO2: (!) 89% 100%    Last Pain:  Vitals:   04/04/23 0957  PainSc: 0-No pain                 [place]

## 2023-04-04 NOTE — Interval H&P Note (Signed)
History and Physical Interval Note:  04/04/2023 10:30 AM  The H and P was reviewed and updated. The patient was examined.  No changes were found after exam.  The surgical eye was marked.  Edwin Andrews

## 2023-04-04 NOTE — Transfer of Care (Signed)
Immediate Anesthesia Transfer of Care Note  Patient: Edwin Andrews  Procedure(s) Performed: CATARACT EXTRACTION PHACO AND INTRAOCULAR LENS PLACEMENT (IOC) (Left: Eye)  Patient Location: Short Stay  Anesthesia Type:MAC  Level of Consciousness: awake  Airway & Oxygen Therapy: Patient Spontanous Breathing  Post-op Assessment: Report given to RN  Post vital signs: Reviewed and stable  Last Vitals:  Vitals Value Taken Time  BP    Temp    Pulse    Resp    SpO2      Last Pain:  Vitals:   04/04/23 0957  PainSc: 0-No pain         Complications: No notable events documented.

## 2023-04-09 ENCOUNTER — Encounter (HOSPITAL_COMMUNITY): Payer: Self-pay | Admitting: Optometry
